# Patient Record
Sex: Female | Born: 1940 | Race: White | Hispanic: No
Health system: Southern US, Community
[De-identification: ages and names within clinical notes are randomized; demographics above are authoritative.]

---

## 2016-10-20 ENCOUNTER — Other Ambulatory Visit (HOSPITAL_COMMUNITY): Payer: Self-pay | Admitting: Family Medicine

## 2016-10-20 DIAGNOSIS — M549 Dorsalgia, unspecified: Secondary | ICD-10-CM

## 2017-02-21 ENCOUNTER — Other Ambulatory Visit: Payer: Self-pay | Admitting: Family Medicine

## 2017-02-27 ENCOUNTER — Ambulatory Visit
Admission: RE | Admit: 2017-02-27 | Discharge: 2017-02-27 | Disposition: A | Discharge status: Home / Self Care | Payer: Medicare Other | Source: Ambulatory Visit | Attending: Family Medicine | Admitting: Family Medicine

## 2017-02-27 DIAGNOSIS — M549 Dorsalgia, unspecified: Secondary | ICD-10-CM

## 2018-10-31 ENCOUNTER — Emergency Department (HOSPITAL_COMMUNITY): Payer: Medicare Other

## 2018-10-31 ENCOUNTER — Emergency Department (HOSPITAL_COMMUNITY)
Admission: EM | Admit: 2018-10-31 | Discharge: 2018-10-31 | Discharge status: Against Medical Advice | Payer: Medicare Other | Attending: Emergency Medicine | Admitting: Emergency Medicine

## 2018-10-31 ENCOUNTER — Other Ambulatory Visit: Payer: Self-pay

## 2018-10-31 DIAGNOSIS — R200 Anesthesia of skin: Secondary | ICD-10-CM | POA: Insufficient documentation

## 2018-10-31 DIAGNOSIS — Z79899 Other long term (current) drug therapy: Secondary | ICD-10-CM | POA: Diagnosis not present

## 2018-10-31 DIAGNOSIS — R000 Tachycardia, unspecified: Secondary | ICD-10-CM | POA: Diagnosis not present

## 2018-10-31 DIAGNOSIS — R202 Paresthesia of skin: Secondary | ICD-10-CM | POA: Insufficient documentation

## 2018-10-31 DIAGNOSIS — Z5320 Procedure and treatment not carried out because of patient's decision for unspecified reasons: Secondary | ICD-10-CM | POA: Insufficient documentation

## 2018-10-31 DIAGNOSIS — M542 Cervicalgia: Secondary | ICD-10-CM | POA: Insufficient documentation

## 2018-10-31 DIAGNOSIS — M79602 Pain in left arm: Secondary | ICD-10-CM | POA: Insufficient documentation

## 2018-10-31 LAB — CBC WITH DIFFERENTIAL/PLATELET
Abs Immature Granulocytes: 0.11 10*3/uL — ABNORMAL HIGH (ref 0.00–0.07)
Basophils Absolute: 0.1 10*3/uL (ref 0.0–0.1)
Basophils Relative: 1 %
Eosinophils Absolute: 0.1 10*3/uL (ref 0.0–0.5)
Eosinophils Relative: 1 %
HCT: 41.5 % (ref 36.0–46.0)
HEMOGLOBIN: 12.5 g/dL (ref 12.0–15.0)
IMMATURE GRANULOCYTES: 1 %
LYMPHS PCT: 22 %
Lymphs Abs: 2 10*3/uL (ref 0.7–4.0)
MCH: 29.2 pg (ref 26.0–34.0)
MCHC: 30.1 g/dL (ref 30.0–36.0)
MCV: 97 fL (ref 80.0–100.0)
Monocytes Absolute: 1 10*3/uL (ref 0.1–1.0)
Monocytes Relative: 11 %
NEUTROS ABS: 5.9 10*3/uL (ref 1.7–7.7)
Neutrophils Relative %: 64 %
Platelets: 338 10*3/uL (ref 150–400)
RBC: 4.28 MIL/uL (ref 3.87–5.11)
RDW: 13.9 % (ref 11.5–15.5)
WBC: 9.2 10*3/uL (ref 4.0–10.5)
nRBC: 0 % (ref 0.0–0.2)

## 2018-10-31 LAB — BASIC METABOLIC PANEL
Anion gap: 7 (ref 5–15)
BUN: 24 mg/dL — ABNORMAL HIGH (ref 8–23)
CO2: 20 mmol/L — ABNORMAL LOW (ref 22–32)
Calcium: 8.3 mg/dL — ABNORMAL LOW (ref 8.9–10.3)
Chloride: 110 mmol/L (ref 98–111)
Creatinine, Ser: 1.14 mg/dL — ABNORMAL HIGH (ref 0.44–1.00)
GFR calc Af Amer: 53 mL/min — ABNORMAL LOW (ref >60)
GFR calc non Af Amer: 46 mL/min — ABNORMAL LOW (ref >60)
Glucose, Bld: 106 mg/dL — ABNORMAL HIGH (ref 70–99)
POTASSIUM: 3.3 mmol/L — AB (ref 3.5–5.1)
Sodium: 137 mmol/L (ref 135–145)

## 2018-10-31 LAB — APTT: aPTT: 28 seconds (ref 24–36)

## 2018-10-31 LAB — PROTIME-INR
INR: 1 (ref 0.8–1.2)
Prothrombin Time: 12.6 seconds (ref 11.4–15.2)

## 2018-10-31 LAB — TROPONIN I

## 2018-10-31 NOTE — ED Notes (Signed)
ED Provider at bedside. 

## 2018-10-31 NOTE — ED Provider Notes (Signed)
[facility] EMERGENCY DEPARTMENT Provider Note   CSN: [id number] Arrival date & time: 10/31/18  1154    History   Chief Complaint Chief Complaint  Patient presents with   Arm Pain    HPI Abigail Barry is a 78 y.o. female.     Patient is a 78 y/o F with PMH htn presents to the ED for acute left arm pain.  Patient reports that she was sitting at home and not doing anything when she began to feel an intense pain in her left arm.  She reports that it radiated from the shoulder into the hand.  She then began to feel numbness and tingling into the entire arm.  Reports that she called EMS.  Reports that when EMS picked her up her arm became blue and she did not have a distal pulse.  EMS confirmed this.  She was given aspirin in route.  Reports that the symptoms are now nearly resolved.  Reports that she now only has pain from the lateral left forearm into the fingers with some numbness in her distal fingers.  She does report that she has arthritis throughout her body but this felt much more intense.  She denies any chest pain, shortness of breath, nausea, abdominal pain, back pain.     No past medical history on file.  There are no active problems to display for this patient.      OB History   No obstetric history on file.      Home Medications    Prior to Admission medications   Medication Sig Start Date End Date Taking? Authorizing Provider  amLODipine (NORVASC) 5 MG tablet Take 5 mg by mouth daily. 09/04/18  Yes [provider]  aspirin EC 81 MG tablet Take 81 mg by mouth daily.   Yes [provider]  levothyroxine (SYNTHROID, LEVOTHROID) 75 MCG tablet Take 75 mcg by mouth daily. 09/04/18  Yes [provider]  LORazepam (ATIVAN) 0.5 MG tablet Take 0.5 mg by mouth at bedtime as needed for anxiety. 09/05/18  Yes [provider]  mirtazapine (REMERON) 15 MG tablet Take 15 mg by mouth daily. 09/04/18  Yes [provider]  PROAIR HFA 108 (90 Base) MCG/ACT inhaler Inhale 2 puffs into the lungs every 6 (six) hours as needed for wheezing or shortness of breath.  09/05/18  Yes [provider]  Monte Fantasia INHUB 250-50 MCG/DOSE AEPB Inhale 2 puffs into the lungs 2 (two) times daily. 09/04/18  Yes [provider]    Family History No family history on file.  Social History Social History   Tobacco Use   Smoking status: Not on file  Substance Use Topics   Alcohol use: Not on file   Drug use: Not on file     Allergies   Patient has no known allergies.   Review of Systems Review of Systems  Constitutional: Negative for chills and fever.  HENT: Negative for ear pain and sore throat.   Eyes: Negative for pain and visual disturbance.  Respiratory: Negative for cough and shortness of breath.   Cardiovascular: Negative for chest pain and palpitations.  Gastrointestinal: Negative for abdominal pain, nausea and vomiting.  Genitourinary: Negative for dysuria and hematuria.  Musculoskeletal: Positive for arthralgias and neck pain. Negative for back pain, gait problem, joint swelling, myalgias and neck stiffness.  Skin: Positive for color change. Negative for pallor, rash and wound.  Neurological: Negative for dizziness, seizures and syncope.  All other systems  reviewed and are negative.    Physical Exam Updated Vital Signs BP 135/65    Pulse 84    Temp 97.8 F (36.6 C) (Oral)    Resp 17    Ht 5\' 2"  (1.575 m)    Wt 57.6 kg    SpO2 96%    BMI 23.23 kg/m   Physical Exam Vitals signs and nursing note reviewed.  Constitutional:      General: She is not in acute distress.    Appearance: Normal appearance. She is not ill-appearing, toxic-appearing or diaphoretic.  HENT:     Head: Normocephalic.     Nose: Nose normal. No congestion.     Mouth/Throat:     Mouth: Mucous membranes are moist.  Eyes:     Conjunctiva/sclera: Conjunctivae normal.  Cardiovascular:     Rate and  Rhythm: Regular rhythm. Tachycardia present.  Pulmonary:     Effort: Pulmonary effort is normal.     Breath sounds: Normal breath sounds.  Abdominal:     General: Abdomen is flat. Bowel sounds are normal.     Palpations: Abdomen is soft.  Skin:    General: Skin is dry.     Capillary Refill: Capillary refill takes less than 2 seconds.     Coloration: Skin is not jaundiced or pale.     Findings: No bruising, erythema, lesion or rash.  Neurological:     General: No focal deficit present.     Mental Status: She is alert and oriented to person, place, and time.     Cranial Nerves: No cranial nerve deficit.     Sensory: No sensory deficit.     Motor: No weakness.     Coordination: Coordination normal.     Gait: Gait normal.     Deep Tendon Reflexes: Reflexes normal.  Psychiatric:        Mood and Affect: Mood normal.      ED Treatments / Results  Labs (all labs ordered are listed, but only abnormal results are displayed) Labs Reviewed  CBC WITH DIFFERENTIAL/PLATELET - Abnormal; Notable for the following components:      Result Value   Abs Immature Granulocytes 0.11 (*)    All other components within normal limits  BASIC METABOLIC PANEL - Abnormal; Notable for the following components:   Potassium 3.3 (*)    CO2 20 (*)    Glucose, Bld 106 (*)    BUN 24 (*)    Creatinine, Ser 1.14 (*)    Calcium 8.3 (*)    GFR calc non Af Amer 46 (*)    GFR calc Af Amer 53 (*)    All other components within normal limits  TROPONIN I  PROTIME-INR  APTT    EKG EKG Interpretation  Date/Time:  Tuesday October 31 2018 11:58:56 EDT Ventricular Rate:  99 PR Interval:    QRS Duration: 99 QT Interval:  345 QTC Calculation: 443 R Axis:   80 Text Interpretation:  Sinus rhythm Borderline T wave abnormalities Confirmed by Donnetta Hutching ([id number]) on 10/31/2018 1:54:40 PM   Radiology Ct Head Wo Contrast  Result Date: 10/31/2018 CLINICAL DATA:  Left upper extremity numbness and tingling EXAM: CT  HEAD WITHOUT CONTRAST TECHNIQUE: Contiguous axial images were obtained from the base of the skull through the vertex without intravenous contrast. COMPARISON:  None. FINDINGS: Brain: There is age related volume loss. There is no appreciable intracranial mass, hemorrhage, extra-axial fluid collection, or midline shift. There is patchy small vessel disease in the centra semiovale  bilaterally. There is evidence of a small infarct in the anterior inferior right centrum semiovale of uncertain age. A recent small infarct in this area is question. 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 There is a small lacunar type infarct in the anterior left lentiform nucleus. Elsewhere brain parenchyma appears unremarkable. Vascular: No hyperdense vessel. There is calcification in each carotid siphon region. Skull: The bony calvarium appears intact. Sinuses/Orbits: Visualized paranasal sinuses are clear. Apparent scleral banding bilaterally. No appreciable orbital asymmetry noted. Other: Mastoid air cells are clear. IMPRESSION: Periventricular small vessel disease. Age uncertain and potentially recent infarct in the anterior inferior right centrum semiovale. 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 No mass or hemorrhage. Foci of arterial vascular calcification noted. Electronically Signed   By: Bretta BangWilliam  Woodruff III M.D.   On: 10/31/2018 13:13    Procedures Procedures (including critical care time)  Medications Ordered in ED Medications - No data to display   Initial Impression / Assessment and Plan / ED Course  I have reviewed the triage vital signs and the nursing notes.  Pertinent labs & imaging results that were available during my care of the patient were reviewed by me and considered in my medical decision making (see chart for details).  Clinical Course as of Oct 31 1507  Tue  Oct 31, 2018  1247 Patient also seen and evaluated by Dr. Donnetta HutchingBrian Cook.  Patient here for acute left pain, numbness, tingling and weakness which was transient and resolved.  Also a report from EMS that she had lost a pulse in the left radius and the limb was blue.  On exam symptoms are nearly completely resolved with only some pain and tingling in the forearm and hand.  Question TIA.  Will obtain labs, EKG, head CT.   [KM]  1505 Patient symptoms have resolved.  She has a question of a subacute infarct on her CT scan.  Labs are unremarkable.  I consulted with neurology.  The stated that based on CT findings and her presenting symptoms this could be TIA versus possible cervical radicular pain.  They suggested to do MRI of the brain first.  I discussed this with the patient.  Patient declines any further testing and would like to go home.  She reports "I have everything in order if I am going to die".  Reports that she understands that this could possibly be a stroke and could be deadly but that she would like to go home without any treatment or further work-up.  Patient appears to be able to make her own decisions and is very competent.  She is very reasonable and repeats both the risks and the benefits of staying to get treatment.  She states that her and her family understand that she does not want any further workup and she would like to go home.  Patient reports that she has had several family members who have dealt with strokes in the past and that she is very aware that her symptoms could likely be a stroke.  Reports that she does not care about further prevention or treatment of stroke.  She reports she was mostly concerned about the pain and did not want to suffer with the pain.  She reports now that the pain is gone, again she would like to go home   [KM]    Clinical  Course User Index [KM] Arlyn Dunning, PA-C         Prior to discharge, I also discussed incidental imaging findings with patient  in detail and advised appropriate, recommended follow-up in detail.  Clinical Impression: 1. Left arm pain     Disposition: Discharge AMA    This note was prepared with assistance of Dragon voice recognition software. Occasional wrong-word or sound-a-like substitutions may have occurred due to the inherent limitations of voice recognition software.   Final Clinical Impressions(s) / ED Diagnoses   Final diagnoses:  Left arm pain    ED Discharge Orders    None       Jeral Pinch 10/31/18 1510    Donnetta Hutching, MD 11/01/18 1024

## 2018-10-31 NOTE — ED Triage Notes (Signed)
Pt arrives via ems from home with reports of L arm pain/numbness. Ems reports initially no pulse on L arm. Ems reports L hand was cyanotic on scene. Pt did complain of chest pain en route. Upon arrival symptoms have resolved. ekg unremarkable. +pulses bil upper extremities. Pt alert and oriented in NAD. 160/100, HR ST 106, RR 18, 98% RA, cbg 135.

## 2018-10-31 NOTE — Discharge Instructions (Addendum)
It is possible you could have had a stroke today.  We do not know for sure because he you declined to have MRI testing.  Please be aware that these symptoms could return, could worsen, or could result in sudden death since we were unable to give you a definitive diagnosis today.  We would be happy to perform any of the further work-up as we discussed if you were to return to the emergency department.  Thank you for allowing me to care for you today. Please return to the emergency department if you have new or worsening symptoms. Take your medications as instructed.

## 2018-10-31 NOTE — ED Notes (Signed)
Pt refusing to stay. Signed AMA.

## 2018-10-31 NOTE — ED Notes (Signed)
Pt's dtr Izora Gala [phone]. Pls contact for update or D/c

## 2018-10-31 NOTE — ED Notes (Signed)
Got patient undress into a gown on the monitor did ekg shown to Dr Adriana Simas patient is resting with call bell in reach

## 2019-03-21 ENCOUNTER — Other Ambulatory Visit: Payer: Self-pay | Admitting: Physical Medicine and Rehabilitation

## 2019-03-21 DIAGNOSIS — M5412 Radiculopathy, cervical region: Secondary | ICD-10-CM

## 2019-04-23 ENCOUNTER — Other Ambulatory Visit: Payer: Self-pay

## 2019-04-23 ENCOUNTER — Ambulatory Visit
Admission: RE | Admit: 2019-04-23 | Discharge: 2019-04-23 | Disposition: A | Discharge status: Home / Self Care | Payer: Medicare Other | Source: Ambulatory Visit | Attending: Physical Medicine and Rehabilitation | Admitting: Physical Medicine and Rehabilitation

## 2019-04-23 DIAGNOSIS — M5412 Radiculopathy, cervical region: Secondary | ICD-10-CM

## 2019-06-10 DEATH — deceased

## 2019-09-18 IMAGING — CT CT HEAD WITHOUT CONTRAST
4 series · 16 of 47 positions shown, 18 images · non-contrast
Comparison: None.

CLINICAL DATA: Left upper extremity numbness and tingling

EXAM:
CT HEAD WITHOUT CONTRAST
TECHNIQUE: Contiguous axial images were obtained from the base of the skull
through the vertex without intravenous contrast.

[Series 3: head wo · axial · 0.42mm/px · z∈[-85,+30]mm · 7 of 31 slices shown, 9 images]
[im 4/31  brain]
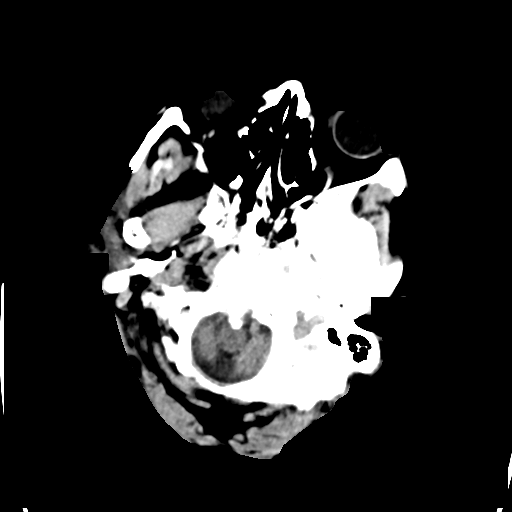
[im 4/31  bone]
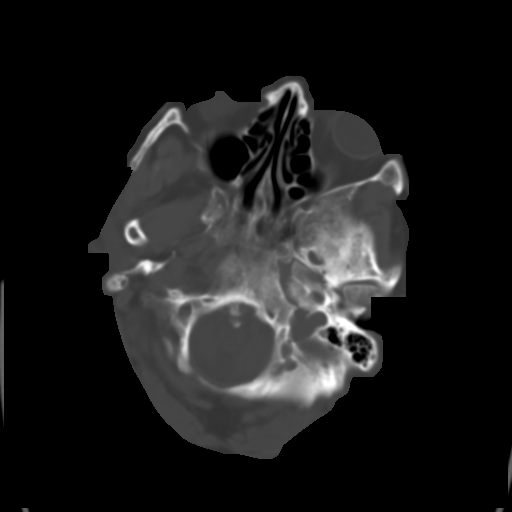
[im 8/31  brain]
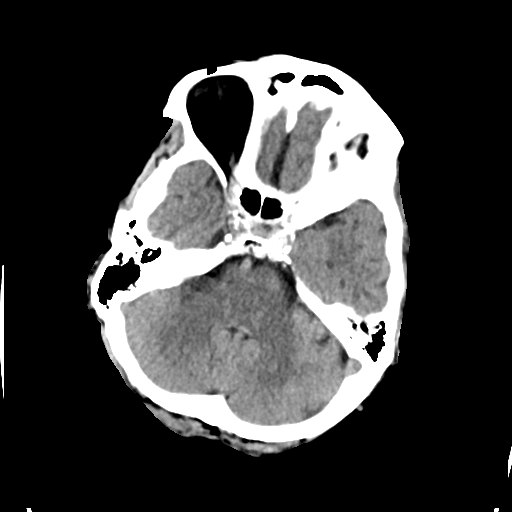
[im 12/31  brain]
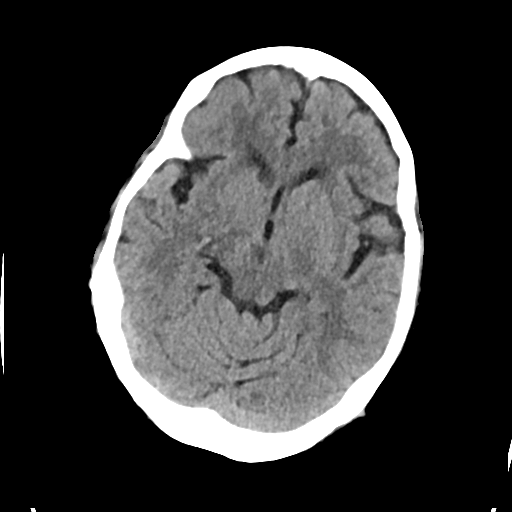
[im 16/31  brain]
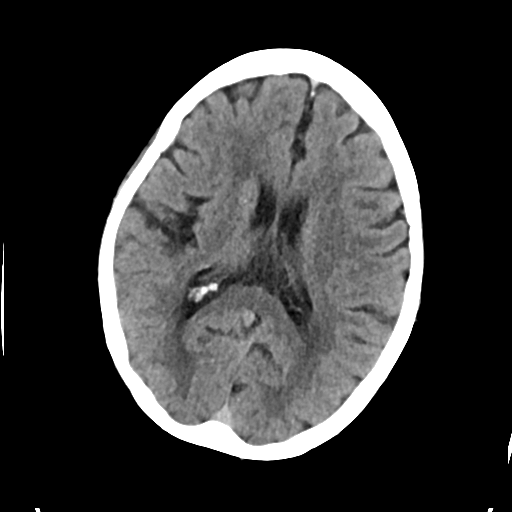
[im 19/31  brain]
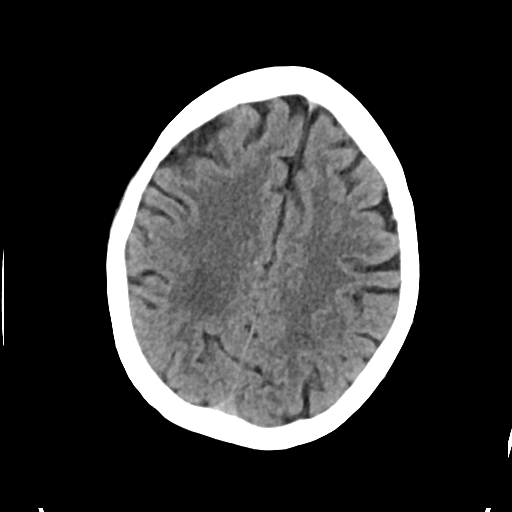
[im 19/31  bone]
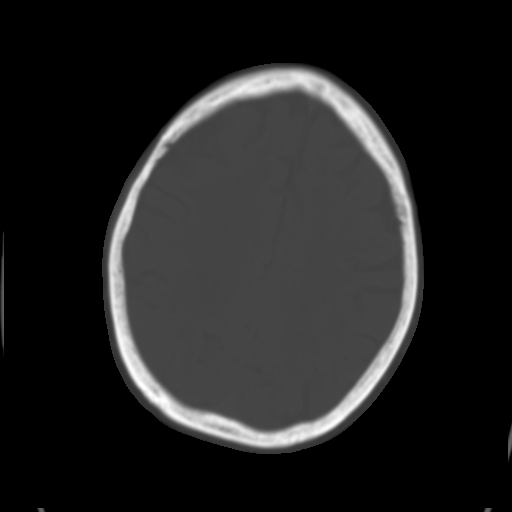
[im 23/31  brain]
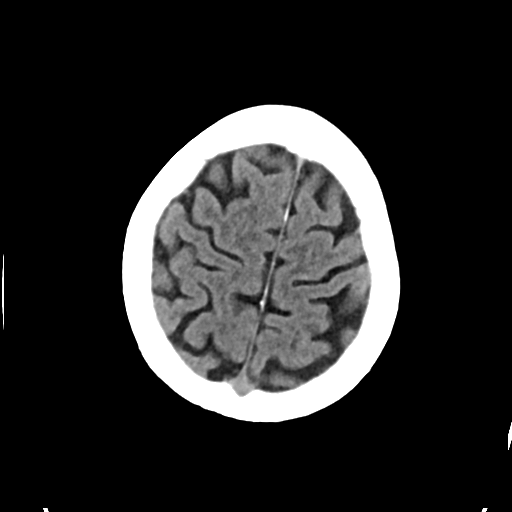
[im 27/31  brain]
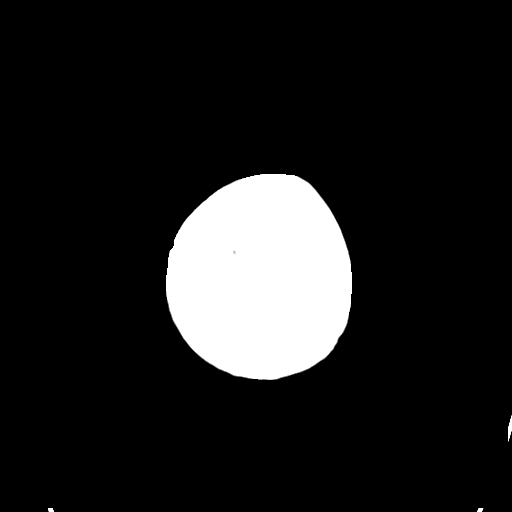

[Series 4: head bone · axial · 0.42mm/px · z∈[-86,-54]mm · 3 of 78 slices shown]
[im 8/78  bone]
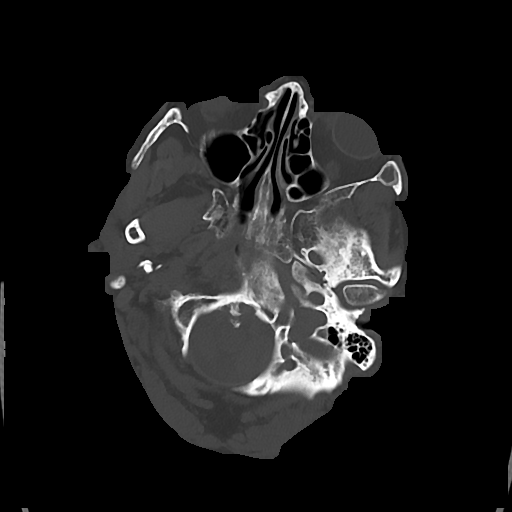
[im 16/78  bone]
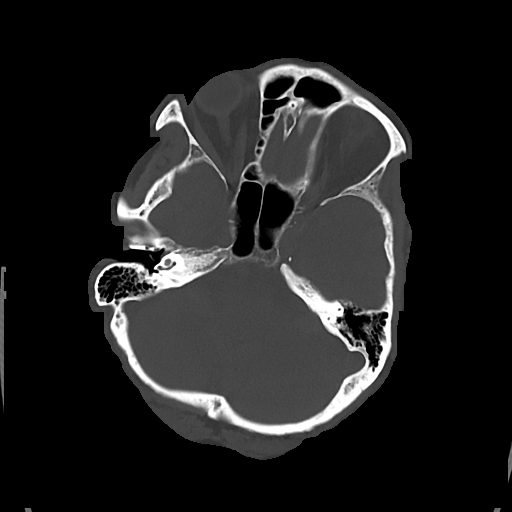
[im 24/78  bone]
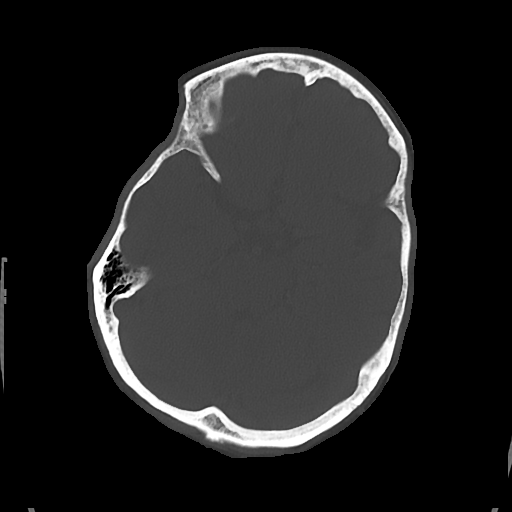

[Series 5: cor soft · coronal · 0.30mm/px · 3 of 67 slices shown]
[im 23/67  brain]
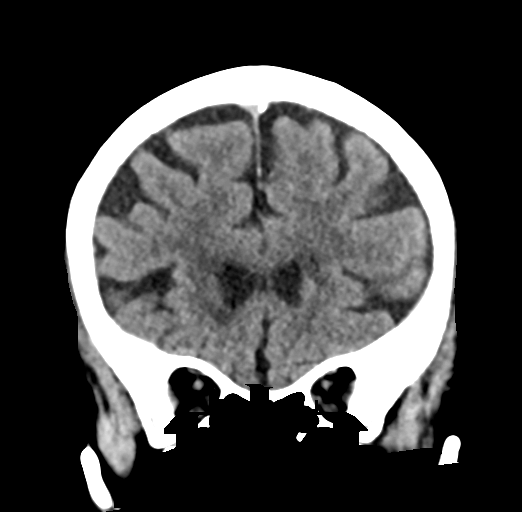
[im 30/67  brain]
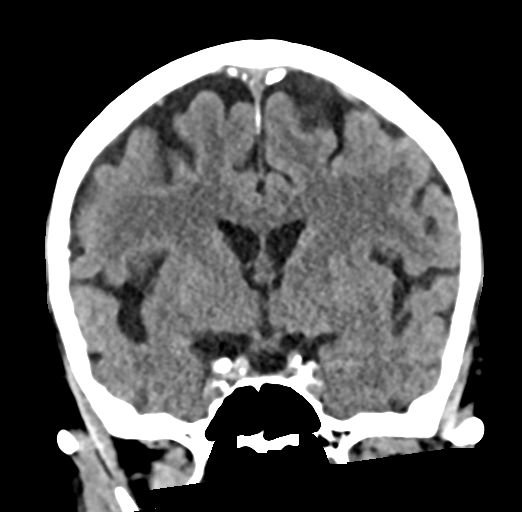
[im 37/67  brain]
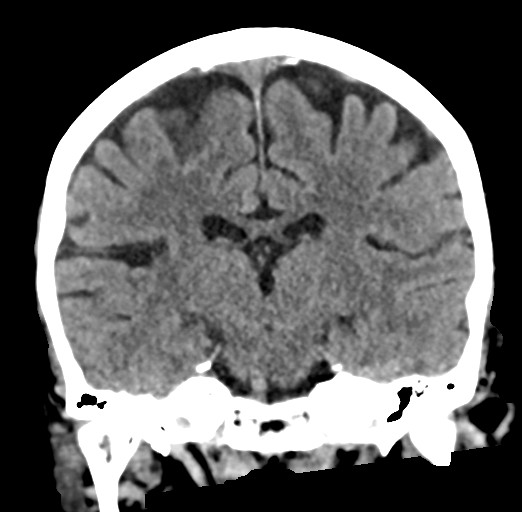

[Series 6: sag soft · sagittal · 0.30mm/px · 3 of 53 slices shown]
[im 18/53  brain]
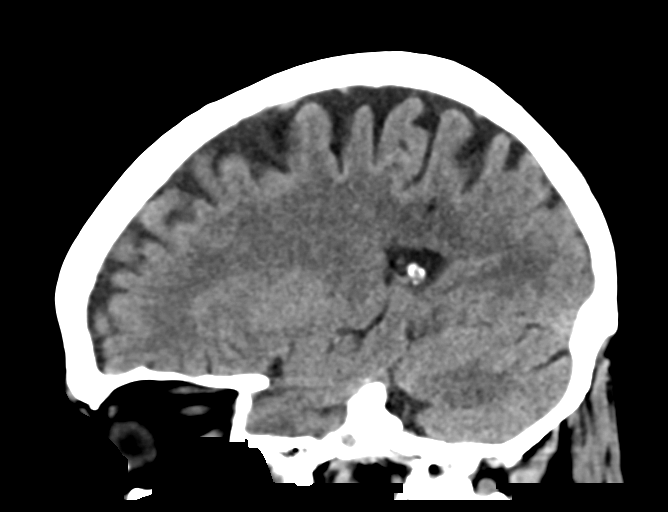
[im 27/53  brain]
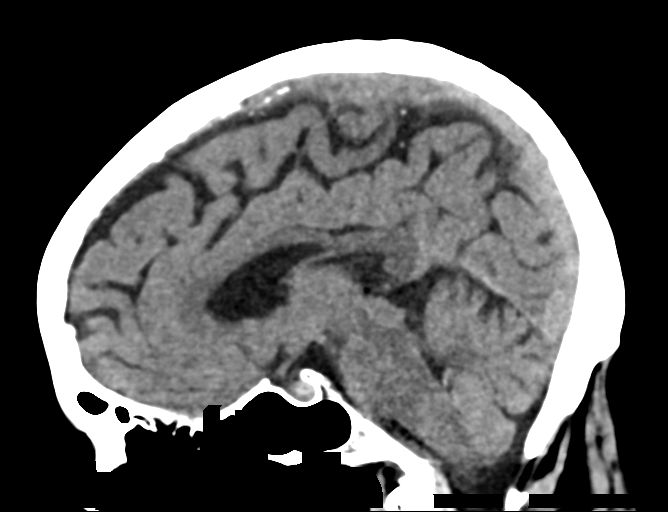
[im 35/53  brain]
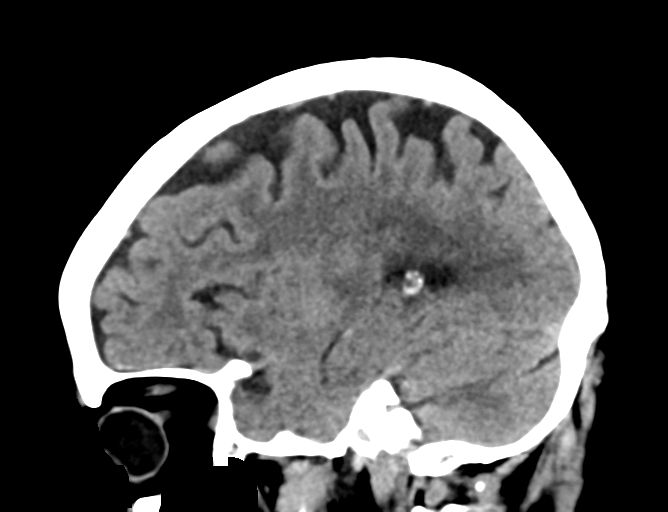

[16 of 47 positions shown; findings below may reference images not displayed]

FINDINGS: Brain: There is age related volume loss. There is no appreciable
intracranial mass, hemorrhage, extra-axial fluid collection, or
midline shift. There is patchy small vessel disease in the centra
semiovale bilaterally. There is evidence of a small infarct in the
anterior inferior right centrum semiovale of uncertain age. A recent
small infarct in this area is question. Elsewhere, there is evidence
of a prior infarct in the head of the caudate nucleus on the right.
There is a small lacunar type infarct in the anterior left lentiform
nucleus. Elsewhere brain parenchyma appears unremarkable.

Vascular: No hyperdense vessel. There is calcification in each
carotid siphon region.

Skull: The bony calvarium appears intact.

Sinuses/Orbits: Visualized paranasal sinuses are clear. Apparent
scleral banding bilaterally. No appreciable orbital asymmetry noted.

Other: Mastoid air cells are clear.
IMPRESSION: Periventricular small vessel disease. Age uncertain and potentially
recent infarct in the anterior inferior right centrum semiovale.
Prior appearing small infarcts in the head of the caudate nucleus on
the right and in the anterior left lentiform nucleus. No mass or
hemorrhage.

Foci of arterial vascular calcification noted.
